# Patient Record
Sex: Female | Born: 2000 | Marital: Married
Health system: Northeastern US, Academic
[De-identification: ages and names within clinical notes are randomized; demographics above are authoritative.]

## PROBLEM LIST (undated history)

## (undated) HISTORY — PX: NO PAST SURGERIES: SHX2092

---
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
## 2019-01-10 ENCOUNTER — Encounter (HOSPITAL_BASED_OUTPATIENT_CLINIC_OR_DEPARTMENT_OTHER): Payer: Self-pay

## 2019-01-10 ENCOUNTER — Other Ambulatory Visit: Payer: Self-pay

## 2019-01-10 ENCOUNTER — Emergency Department (HOSPITAL_BASED_OUTPATIENT_CLINIC_OR_DEPARTMENT_OTHER)
Admission: EM | Admit: 2019-01-10 | Discharge: 2019-01-11 | Disposition: A | Discharge status: Home / Self Care | Payer: Self-pay | Attending: Emergency Medicine | Admitting: Emergency Medicine

## 2019-01-10 DIAGNOSIS — J069 Acute upper respiratory infection, unspecified: Secondary | ICD-10-CM | POA: Insufficient documentation

## 2019-01-10 DIAGNOSIS — F17210 Nicotine dependence, cigarettes, uncomplicated: Secondary | ICD-10-CM | POA: Insufficient documentation

## 2019-01-10 DIAGNOSIS — R0602 Shortness of breath: Secondary | ICD-10-CM | POA: Insufficient documentation

## 2019-01-10 DIAGNOSIS — Z20828 Contact with and (suspected) exposure to other viral communicable diseases: Secondary | ICD-10-CM | POA: Insufficient documentation

## 2019-01-10 MED ORDER — SODIUM CHLORIDE 0.9 % IV BOLUS
1000.0000 mL | Freq: Once | INTRAVENOUS | Status: AC
  Administered 2019-01-11: 1000 mL via INTRAVENOUS

## 2019-01-10 MED ORDER — ACETAMINOPHEN 325 MG PO TABS
650.0000 mg | ORAL_TABLET | Freq: Once | ORAL | Status: AC
  Administered 2019-01-11: 650 mg via ORAL
  Filled 2019-01-10: qty 2

## 2019-01-10 NOTE — ED Triage Notes (Signed)
Pt reports cough, fevers and SOB since this morning.

## 2019-01-11 ENCOUNTER — Emergency Department (HOSPITAL_BASED_OUTPATIENT_CLINIC_OR_DEPARTMENT_OTHER): Payer: Self-pay

## 2019-01-11 LAB — CBC WITH DIFFERENTIAL/PLATELET
Abs Immature Granulocytes: 0.03 10*3/uL (ref 0.00–0.07)
Basophils Absolute: 0.1 10*3/uL (ref 0.0–0.1)
Basophils Relative: 1 %
Eosinophils Absolute: 0.2 10*3/uL (ref 0.0–0.5)
Eosinophils Relative: 2 %
HCT: 34 % — ABNORMAL LOW (ref 36.0–46.0)
Hemoglobin: 9.9 g/dL — ABNORMAL LOW (ref 12.0–15.0)
Immature Granulocytes: 0 %
Lymphocytes Relative: 13 %
Lymphs Abs: 1.4 10*3/uL (ref 0.7–4.0)
MCH: 20.4 pg — ABNORMAL LOW (ref 26.0–34.0)
MCHC: 29.1 g/dL — ABNORMAL LOW (ref 30.0–36.0)
MCV: 70 fL — ABNORMAL LOW (ref 80.0–100.0)
Monocytes Absolute: 0.7 10*3/uL (ref 0.1–1.0)
Monocytes Relative: 7 %
Neutro Abs: 8 10*3/uL — ABNORMAL HIGH (ref 1.7–7.7)
Neutrophils Relative %: 77 %
Platelets: 368 10*3/uL (ref 150–400)
RBC: 4.86 MIL/uL (ref 3.87–5.11)
RDW: 18.8 % — ABNORMAL HIGH (ref 11.5–15.5)
WBC: 10.4 10*3/uL (ref 4.0–10.5)
nRBC: 0 % (ref 0.0–0.2)

## 2019-01-11 LAB — URINALYSIS, ROUTINE W REFLEX MICROSCOPIC
Bilirubin Urine: NEGATIVE
Glucose, UA: NEGATIVE mg/dL
Ketones, ur: NEGATIVE mg/dL
Nitrite: NEGATIVE
Protein, ur: NEGATIVE mg/dL
Specific Gravity, Urine: 1.01 (ref 1.005–1.030)
pH: 7 (ref 5.0–8.0)

## 2019-01-11 LAB — BASIC METABOLIC PANEL
Anion gap: 10 (ref 5–15)
BUN: 13 mg/dL (ref 6–20)
CO2: 23 mmol/L (ref 22–32)
Calcium: 9.4 mg/dL (ref 8.9–10.3)
Chloride: 105 mmol/L (ref 98–111)
Creatinine, Ser: 0.67 mg/dL (ref 0.44–1.00)
GFR calc Af Amer: >60 mL/min (ref >60)
GFR calc non Af Amer: >60 mL/min (ref >60)
Glucose, Bld: 100 mg/dL — ABNORMAL HIGH (ref 70–99)
Potassium: 3.5 mmol/L (ref 3.5–5.1)
Sodium: 138 mmol/L (ref 135–145)

## 2019-01-11 LAB — SARS CORONAVIRUS 2 AG (30 MIN TAT): SARS Coronavirus 2 Ag: NEGATIVE

## 2019-01-11 LAB — URINALYSIS, MICROSCOPIC (REFLEX): RBC / HPF: >50 RBC/hpf (ref 0–5)

## 2019-01-11 LAB — PREGNANCY, URINE: Preg Test, Ur: NEGATIVE

## 2019-01-11 MED ORDER — BENZONATATE 100 MG PO CAPS: 100.0000 mg | ORAL_CAPSULE | Freq: Three times a day (TID) | ORAL | 0 refills | Status: DC

## 2019-01-11 MED ORDER — KETOROLAC TROMETHAMINE 30 MG/ML IJ SOLN
30.0000 mg | Freq: Once | INTRAMUSCULAR | Status: AC
Start: 2019-01-11 — End: 2019-01-11
  Administered 2019-01-11: 30 mg via INTRAVENOUS
  Filled 2019-01-11: qty 1

## 2019-01-11 MED ORDER — IBUPROFEN 600 MG PO TABS: 600.0000 mg | ORAL_TABLET | Freq: Four times a day (QID) | ORAL | 0 refills | Status: DC | PRN

## 2019-01-11 NOTE — Discharge Instructions (Addendum)
You were seen today for upper respiratory complaints.  It is important that you stay hydrated.  Your work-up including chest x-ray is reassuring.  Take medications as prescribed.  You were tested for COVID and your initial testing is reassuring.  Outpatient testing is pending.  You should self isolate until definitive COVID testing has resulted.

## 2019-01-11 NOTE — ED Provider Notes (Signed)
[facility] EMERGENCY DEPARTMENT Provider Note   CSN: [id number] Arrival date & time: 01/10/19  2251     History   Chief Complaint Chief Complaint  Patient presents with  . Cough    HPI Barbara Braun is a 18 y.o. female.     HPI  This is a 18 year old female who presents with cough, congestion, shortness of breath.  Patient reports that onset of symptoms within the last 12 to 24 hours.  She reports chills at home.  Fever up to 100.  No known COVID exposures.  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  She reports nausea without vomiting.  She reports chest pain is worse with cough and breathing.  She reports shortness of breath.  Denies any abdominal pain or urinary symptoms.  No back pain.  History reviewed. No pertinent past medical history.  There are no active problems to display for this patient.   History reviewed. No pertinent surgical history.   OB History   No obstetric history on file.      Home Medications    Prior to Admission medications   Medication Sig Start Date End Date Taking? Authorizing Provider  benzonatate (TESSALON) 100 MG capsule Take 1 capsule (100 mg total) by mouth every 8 (eight) hours. 01/11/19   Lian Pounds, Mayer Maskerourtney F, MD  ibuprofen (ADVIL) 600 MG tablet Take 1 tablet (600 mg total) by mouth every 6 (six) hours as needed. 01/11/19   Cortlandt Capuano, Mayer Maskerourtney F, MD  methocarbamol (ROBAXIN) 500 MG tablet Take 1 tablet (500 mg total) by mouth 2 (two) times daily. 02/14/18   Maczis, Elmer SowMichael M, PA-C  naproxen (NAPROSYN) 500 MG tablet Take 1 tablet (500 mg total) by mouth 2 (two) times daily. 02/14/18   Maczis, Elmer SowMichael M, PA-C    Family History No family history on file.  Social History Social History   Tobacco Use  . Smoking status: Smoker, Current Status Unknown  Substance Use Topics  . Alcohol use: Never    Frequency: Never  . Drug use: Never     Allergies   Patient has no known allergies.   Review of Systems  Review of Systems  Constitutional: Positive for chills and fever.  HENT: Positive for congestion.   Respiratory: Positive for cough and shortness of breath.   Cardiovascular: Positive for chest pain. Negative for leg swelling.  Gastrointestinal: Positive for nausea. Negative for abdominal pain and vomiting.  Genitourinary: Negative for dysuria and flank pain.  All other systems reviewed and are negative.    Physical Exam Updated Vital Signs BP (!) 137/91   Pulse (!) 101   Temp 98.6 F (37 C) (Oral)   Resp (!) 24   LMP 01/10/2019   SpO2 97%   Physical Exam Vitals signs and nursing note reviewed.  Constitutional:      Appearance: She is well-developed. She is not ill-appearing.  HENT:     Head: Normocephalic and atraumatic.     Nose: Congestion present.     Mouth/Throat:     Mouth: Mucous membranes are moist.     Comments: Uvula midline, no tonsillar exudate or erythema Eyes:     Pupils: Pupils are equal, round, and reactive to light.  Neck:     Musculoskeletal: Neck supple.  Cardiovascular:     Rate and Rhythm: Regular rhythm. Tachycardia present.     Heart sounds: Normal heart sounds.  Pulmonary:     Effort: Pulmonary effort is normal. No respiratory distress.  Breath sounds: No wheezing.  Abdominal:     Palpations: Abdomen is soft.     Tenderness: There is no abdominal tenderness.  Musculoskeletal:     Right lower leg: No edema.     Left lower leg: No edema.  Skin:    General: Skin is warm and dry.  Neurological:     Mental Status: She is alert and oriented to person, place, and time.  Psychiatric:        Mood and Affect: Mood normal.      ED Treatments / Results  Labs (all labs ordered are listed, but only abnormal results are displayed) Labs Reviewed  CBC WITH DIFFERENTIAL/PLATELET - Abnormal; Notable for the following components:      Result Value   Hemoglobin 9.9 (*)    HCT 34.0 (*)    MCV 70.0 (*)    MCH 20.4 (*)    MCHC 29.1 (*)    RDW  18.8 (*)    Neutro Abs 8.0 (*)    All other components within normal limits  BASIC METABOLIC PANEL - Abnormal; Notable for the following components:   Glucose, Bld 100 (*)    All other components within normal limits  URINALYSIS, ROUTINE W REFLEX MICROSCOPIC - Abnormal; Notable for the following components:   Color, Urine RED (*)    APPearance CLOUDY (*)    Hgb urine dipstick LARGE (*)    Leukocytes,Ua TRACE (*)    All other components within normal limits  URINALYSIS, MICROSCOPIC (REFLEX) - Abnormal; Notable for the following components:   Bacteria, UA RARE (*)    All other components within normal limits  SARS CORONAVIRUS 2 (HOSP ORDER, PERFORMED IN [facility] VIA ABBOTT ID)  NOVEL CORONAVIRUS, NAA (HOSPITAL ORDER, SEND-OUT TO REF LAB)  PREGNANCY, URINE    EKG None  Radiology Dg Chest Port 1 View  Result Date: 01/11/2019 CLINICAL DATA:  18 y/o  F; cough, fever, shortness of breath. EXAM: PORTABLE CHEST 1 VIEW COMPARISON:  None. FINDINGS: The heart size and mediastinal contours are within normal limits. Both lungs are clear. The visualized skeletal structures are unremarkable. IMPRESSION: No active disease. Electronically Signed   By: Kristine Garbe M.D.   On: 01/11/2019 00:29    Procedures Procedures (including critical care time)  Medications Ordered in ED Medications  ketorolac (TORADOL) 30 MG/ML injection 30 mg (has no administration in time range)  acetaminophen (TYLENOL) tablet 650 mg (650 mg Oral Given 01/11/19 0001)  sodium chloride 0.9 % bolus 1,000 mL (0 mLs Intravenous Stopped 01/11/19 0026)     Initial Impression / Assessment and Plan / ED Course  I have reviewed the triage vital signs and the nursing notes.  Pertinent labs & imaging results that were available during my care of the patient were reviewed by me and considered in my medical decision making (see chart for details).        Patient presents today with upper respiratory symptoms.   She is overall nontoxic-appearing on exam.  Vital signs are notable for pulse rate in the 110s.  She is in no respiratory distress.  Suspect likely viral etiology.  She is low risk for COVID although COVID testing was sent.  Chest x-ray shows no evidence of pneumonia.  Basic lab work including CBC, BMP and urinalysis are largely unremarkable.  Patient was given fluids, Tylenol, and Toradol.  Recommend supportive measures at home.  Initial COVID testing was negative.  Outpatient testing is pending.  After history, exam, and  medical workup I feel the patient has been appropriately medically screened and is safe for discharge home. Pertinent diagnoses were discussed with the patient. Patient was given return precautions.   Barbara Braun was evaluated in Emergency Department on 01/11/2019 for the symptoms described in the history of present illness. She was evaluated in the context of the global COVID-19 pandemic, which necessitated consideration that the patient might be at risk for infection with the SARS-CoV-2 virus that causes COVID-19. Institutional protocols and algorithms that pertain to the evaluation of patients at risk for COVID-19 are in a state of rapid change based on information released by regulatory bodies including the CDC and federal and state organizations. These policies and algorithms were followed during the patient's care in the ED.   Final Clinical Impressions(s) / ED Diagnoses   Final diagnoses:  Viral URI with cough    ED Discharge Orders         Ordered    benzonatate (TESSALON) 100 MG capsule  Every 8 hours     01/11/19 0256    ibuprofen (ADVIL) 600 MG tablet  Every 6 hours PRN     01/11/19 0256           Shon BatonHorton, Jamarii Banks F, MD 01/11/19 0300

## 2019-01-11 NOTE — ED Notes (Signed)
PT tolerating PO(s well

## 2019-01-12 LAB — NOVEL CORONAVIRUS, NAA (HOSP ORDER, SEND-OUT TO REF LAB; TAT 18-24 HRS): SARS-CoV-2, NAA: NOT DETECTED

## 2019-01-25 ENCOUNTER — Encounter (HOSPITAL_COMMUNITY): Payer: Self-pay

## 2019-01-25 ENCOUNTER — Emergency Department (HOSPITAL_COMMUNITY)
Admission: EM | Admit: 2019-01-25 | Discharge: 2019-01-25 | Disposition: A | Discharge status: Home / Self Care | Payer: Self-pay | Attending: Emergency Medicine | Admitting: Emergency Medicine

## 2019-01-25 ENCOUNTER — Other Ambulatory Visit: Payer: Self-pay

## 2019-01-25 DIAGNOSIS — L02416 Cutaneous abscess of left lower limb: Secondary | ICD-10-CM | POA: Insufficient documentation

## 2019-01-25 DIAGNOSIS — L0291 Cutaneous abscess, unspecified: Secondary | ICD-10-CM

## 2019-01-25 DIAGNOSIS — Z79899 Other long term (current) drug therapy: Secondary | ICD-10-CM | POA: Insufficient documentation

## 2019-01-25 MED ORDER — CLINDAMYCIN HCL 150 MG PO CAPS: 300.0000 mg | ORAL_CAPSULE | Freq: Three times a day (TID) | ORAL | 0 refills | Status: AC

## 2019-01-25 MED ORDER — LIDOCAINE-PRILOCAINE 2.5-2.5 % EX CREA
TOPICAL_CREAM | Freq: Once | CUTANEOUS | Status: AC
  Administered 2019-01-25: 02:00:00 via TOPICAL
  Filled 2019-01-25: qty 5

## 2019-01-25 MED ORDER — HYDROCODONE-ACETAMINOPHEN 5-325 MG PO TABS
1.0000 | ORAL_TABLET | Freq: Once | ORAL | Status: AC
  Administered 2019-01-25: 1 via ORAL
  Filled 2019-01-25: qty 1

## 2019-01-25 NOTE — ED Notes (Signed)
ED Provider at bedside. 

## 2019-01-25 NOTE — ED Triage Notes (Signed)
Pt reports ? Bite to left leg.  Reports redness and swelling to back of left leg.  Denies Drainage.  Denies fevers.  NAD

## 2019-01-25 NOTE — ED Provider Notes (Signed)
[facility] EMERGENCY DEPARTMENT Provider Note   CSN: [id number] Arrival date & time: 01/25/19  0037     History   Chief Complaint Chief Complaint  Patient presents with  . Insect Bite    HPI Barbara Braun is a 18 y.o. female.     Patient report redness and swelling and tenderness to the back of the left upper leg.  Patient reports possible bite.  No drainage from the area.  No fevers.  Hurts to move the leg.  No family history of abscess.  No personal history of abscess.  No numbness, no weakness.  The history is provided by the patient. No language interpreter was used.  Abscess Location:  Leg Leg abscess location:  L upper leg Size:  5 Abscess quality: induration, painful, redness and warmth   Red streaking: no   Duration:  3 days Progression:  Worsening Pain details:    Quality:  Aching   Severity:  Mild   Duration:  2 days   Timing:  Intermittent   Progression:  Unchanged Chronicity:  New Context: insect bite/sting   Context: not diabetes and not skin injury   Relieved by:  None tried Ineffective treatments:  None tried Associated symptoms: no anorexia, no fatigue, no fever, no headaches, no nausea and no vomiting   Risk factors: no hx of MRSA and no prior abscess     History reviewed. No pertinent past medical history.  There are no active problems to display for this patient.   History reviewed. No pertinent surgical history.   OB History   No obstetric history on file.      Home Medications    Prior to Admission medications   Medication Sig Start Date End Date Taking? Authorizing Provider  benzonatate (TESSALON) 100 MG capsule Take 1 capsule (100 mg total) by mouth every 8 (eight) hours. 01/11/19   Horton, Barbette Hair, MD  clindamycin (CLEOCIN) 150 MG capsule Take 2 capsules (300 mg total) by mouth 3 (three) times daily for 7 days. 01/25/19 02/01/19  Louanne Skye, MD  ibuprofen (ADVIL) 600 MG tablet Take 1 tablet (600 mg total) by  mouth every 6 (six) hours as needed. 01/11/19   Horton, Barbette Hair, MD  methocarbamol (ROBAXIN) 500 MG tablet Take 1 tablet (500 mg total) by mouth 2 (two) times daily. 02/14/18   Maczis, Barth Kirks, PA-C  naproxen (NAPROSYN) 500 MG tablet Take 1 tablet (500 mg total) by mouth 2 (two) times daily. 02/14/18   Maczis, Barth Kirks, PA-C    Family History No family history on file.  Social History Social History   Tobacco Use  . Smoking status: Smoker, Current Status Unknown  Substance Use Topics  . Alcohol use: Never    Frequency: Never  . Drug use: Never     Allergies   Patient has no known allergies.   Review of Systems Review of Systems  Constitutional: Negative for fatigue and fever.  Gastrointestinal: Negative for anorexia, nausea and vomiting.  Neurological: Negative for headaches.  All other systems reviewed and are negative.    Physical Exam Updated Vital Signs BP 123/78   Pulse 92   Temp 98.2 F (36.8 C) (Oral)   Resp 20   LMP 01/10/2019   SpO2 100%   Physical Exam Vitals signs and nursing note reviewed.  Constitutional:      Appearance: She is well-developed.  HENT:     Head: Normocephalic and atraumatic.     Right Ear: External ear  normal.     Left Ear: External ear normal.  Eyes:     Conjunctiva/sclera: Conjunctivae normal.  Neck:     Musculoskeletal: Normal range of motion and neck supple.  Cardiovascular:     Rate and Rhythm: Normal rate.     Heart sounds: Normal heart sounds.  Pulmonary:     Effort: Pulmonary effort is normal.     Breath sounds: Normal breath sounds.  Abdominal:     General: Bowel sounds are normal.     Palpations: Abdomen is soft.     Tenderness: There is no abdominal tenderness. There is no rebound.  Musculoskeletal: Normal range of motion.  Skin:    Comments: Patient with 5 cm of redness, with 4 cm of induration.  Small central area noted but no head or drainage noted.  It is tender and warm to touch.  Neurological:      Mental Status: She is alert and oriented to person, place, and time.      ED Treatments / Results  Labs (all labs ordered are listed, but only abnormal results are displayed) Labs Reviewed - No data to display  EKG None  Radiology No results found.  Procedures .Marland Kitchen.Incision and Drainage  Date/Time: 01/25/2019 3:39 AM Performed by: Niel HummerKuhner, Dorse Locy, MD Authorized by: Niel HummerKuhner, Isom Kochan, MD   Consent:    Consent obtained:  Verbal   Consent given by:  Patient   Risks discussed:  Bleeding, incomplete drainage and infection   Alternatives discussed:  Observation and no treatment Location:    Type:  Abscess   Size:  5   Location:  Lower extremity   Lower extremity location:  Leg   Leg location:  L upper leg Pre-procedure details:    Skin preparation:  Betadine Anesthesia (see MAR for exact dosages):    Anesthesia method:  Topical application   Topical anesthetic:  EMLA cream Procedure type:    Complexity:  Simple Procedure details:    Incision types:  Stab incision   Incision depth:  Fascia   Scalpel blade:  11   Drainage:  Purulent   Drainage amount:  Moderate   Wound treatment:  Drain placed   Packing materials:  1/4 in gauze Post-procedure details:    Patient tolerance of procedure:  Tolerated well, no immediate complications   (including critical care time)  Medications Ordered in ED Medications  lidocaine-prilocaine (EMLA) cream ( Topical Given 01/25/19 0139)  HYDROcodone-acetaminophen (NORCO/VICODIN) 5-325 MG per tablet 1 tablet (1 tablet Oral Given 01/25/19 0139)     Initial Impression / Assessment and Plan / ED Course  I have reviewed the triage vital signs and the nursing notes.  Pertinent labs & imaging results that were available during my care of the patient were reviewed by me and considered in my medical decision making (see chart for details).        18 year old with warm red indurated area on posterior upper thigh.  Patient with likely abscess.  Patient  given pain medication and EMLA was placed on wound and I was able to make a incision and drained copious amounts of pus and blood.  Wound was packed with quarter inch gauze.  Will start patient on clindamycin.  Will have patient follow-up with PCP in 3 days for reevaluation.  Discussed signs warrant sooner reevaluation.    Final Clinical Impressions(s) / ED Diagnoses   Final diagnoses:  Abscess    ED Discharge Orders         Ordered  clindamycin (CLEOCIN) 150 MG capsule  3 times daily     01/25/19 0244           Niel HummerKuhner, Karren Newland, MD 01/25/19 [phone]

## 2019-05-13 ENCOUNTER — Telehealth: Payer: Self-pay | Admitting: Family Medicine

## 2019-05-13 NOTE — Telephone Encounter (Signed)
Patient is calling to receive negative COVID results. Patient expressed understanding. °

## 2019-08-19 ENCOUNTER — Ambulatory Visit: Payer: Self-pay

## 2019-08-19 NOTE — Telephone Encounter (Signed)
Patient called stating that she had her 1st COVID-19 vaccine yesterday RT arm.  She states that before she got home her arm was hurting terribly.  She rates the pain at 10.  She states that she just feels bad.  She is nauseated. She is concerned because she has had recent HepB vaccine and TB test 1 week ago in left arm.  She states she told them about the vaccine prior to receiving the COVID-19 vaccine. She states she has a swollen lump on the affected right arm. Per protocol patient will go to UC for evaluation. She has no PCP. She was read care advice and verbalized understanding.  Reason for Disposition . [1] Redness or red streak around the injection site AND [2] started > 48 hours after getting vaccine AND [3] fever  Answer Assessment - Initial Assessment Questions 1. MAIN CONCERN OR SYMPTOM:  "What is your main concern right now?" "What question do you have?" "What's the main symptom you're worried about?" (e.g., fever, pain, redness, swelling)    pain 2. VACCINE: "What vaccination did you receive?" "Is this your first or second shot?" (e.g., none; Moderna, Pfizer, other)     1st dose unsure 3. SYMPTOM ONSET: "When did the pain begin?" (e.g., not relevant; hours, days)      Received yesterday about 11:30. Pain in arm rt 4. SYMPTOM SEVERITY: "How bad is it?"      Pain 10 5. FEVER: "Is there a fever?" If so, ask: "What is it, how was it measured, and when did it start?"      unknown 6. PAST REACTIONS: "Have you reacted to immunizations before?" If so, ask: "What happened?"    no 7. OTHER SYMPTOMS: "Do you have any other symptoms?"     Nausea, confusion,  Protocols used: CORONAVIRUS (COVID-19) VACCINE QUESTIONS AND REACTIONS-A-AH

## 2019-09-22 ENCOUNTER — Encounter: Payer: Self-pay | Admitting: Obstetrics & Gynecology

## 2019-09-22 ENCOUNTER — Other Ambulatory Visit: Payer: Self-pay

## 2019-09-22 ENCOUNTER — Ambulatory Visit (INDEPENDENT_AMBULATORY_CARE_PROVIDER_SITE_OTHER): Payer: Medicaid Other | Admitting: Obstetrics & Gynecology

## 2019-09-22 VITALS — BP 119/74 | HR 83 | Temp 97.1°F | Ht 59.0 in | Wt 112.0 lb

## 2019-09-22 DIAGNOSIS — Z7189 Other specified counseling: Secondary | ICD-10-CM | POA: Diagnosis not present

## 2019-09-22 DIAGNOSIS — Z30011 Encounter for initial prescription of contraceptive pills: Secondary | ICD-10-CM | POA: Diagnosis not present

## 2019-09-22 DIAGNOSIS — Z7185 Encounter for immunization safety counseling: Secondary | ICD-10-CM

## 2019-09-22 MED ORDER — NORETHIN ACE-ETH ESTRAD-FE 1-20 MG-MCG(24) PO TABS
1.0000 | ORAL_TABLET | Freq: Every day | ORAL | 11 refills | Status: AC
Start: 2019-09-22 — End: ?

## 2019-09-22 NOTE — Patient Instructions (Signed)
Oral Contraception Information Oral contraceptive pills (OCPs) are medicines taken to prevent pregnancy. OCPs are taken by mouth, and they work by:  Preventing the ovaries from releasing eggs.  Thickening mucus in the lower part of the uterus (cervix), which prevents sperm from entering the uterus.  Thinning the lining of the uterus (endometrium), which prevents a fertilized egg from attaching to the endometrium. OCPs are highly effective when taken exactly as prescribed. However, OCPs do not prevent STIs (sexually transmitted infections). Safe sex practices, such as using condoms while on an OCP, can help prevent STIs. Before starting OCPs Before you start taking OCPs, you may have a physical exam, blood test, and Pap test. However, you are not required to have a pelvic exam in order to be prescribed OCPs. Your health care provider will make sure you are a good candidate for oral contraception. OCPs are not a good option for certain women, including women who smoke and are older than 35 years, and women with a medical history of high blood pressure, deep vein thrombosis, pulmonary embolism, stroke, cardiovascular disease, or peripheral vascular disease. Discuss with your health care provider the possible side effects of the OCP you may be prescribed. When you start an OCP, be aware that it can take 2-3 months for your body to adjust to changes in hormone levels. Follow instructions from your health care provider about how to start taking your first cycle of OCPs. Depending on when you start the pill, you may need to use a backup form of birth control, such as condoms, during the first week. Make sure you know what steps to take if you ever forget to take the pill. Types of oral contraception  The most common types of birth control pills contain the hormones estrogen and progestin (synthetic progesterone) or progestin only. The combination pill This type of pill contains estrogen and progestin  hormones. Combination pills often come in packs of 21, 28, or 91 pills. For each pack, the last 7 pills may not contain hormones, which means you may stop taking the pills for 7 days. Menstrual bleeding occurs during the week that you do not take the pills or that you take the pills with no hormones in them. The minipill This type of pill contains the progestin hormone only. It comes in packs of 28 pills. All 28 pills contain the hormone. You take the pill every day. It is very important to take the pill at the same time each day. Advantages of oral contraceptive pills  Provides reliable and continuous contraception if taken as instructed.  May treat or decrease symptoms of: ? Menstrual period cramps. ? Irregular menstrual cycle or bleeding. ? Heavy menstrual flow. ? Abnormal uterine bleeding. ? Acne, depending on the type of pill. ? Polycystic ovarian syndrome. ? Endometriosis. ? Iron deficiency anemia. ? Premenstrual symptoms, including premenstrual dysphoric disorder.  May reduce the risk of endometrial and ovarian cancer.  Can be used as emergency contraception.  Prevents mislocated (ectopic) pregnancies and infections of the fallopian tubes. Things that can make oral contraceptive pills less effective OCPs can be less effective if:  You forget to take the pill at the same time every day. This is especially important when taking the minipill.  You have a stomach or intestinal disease that reduces your body's ability to absorb the pill.  You take OCPs with other medicines that make OCPs less effective, such as antibiotics, certain HIV medicines, and some seizure medicines.  You take expired OCPs.  You forget to restart the pill on day 7, if using the packs of 21 pills. Risks associated with oral contraceptive pills Oral contraceptive pills can sometimes cause side effects, such as:  Headache.  Depression.  Trouble sleeping.  Nausea and vomiting.  Breast  tenderness.  Irregular bleeding or spotting during the first several months.  Bloating or fluid retention.  Increase in blood pressure. Combination pills are also associated with a small increase in the risk of:  Blood clots.  Heart attack.  Stroke. Summary  Oral contraceptive pills are medicines taken by mouth to prevent pregnancy. They are highly effective when taken exactly as prescribed.  The most common types of birth control pills contain the hormones estrogen and progestin (synthetic progesterone) or progestin only.  Before you start taking the pill, you may have a physical exam, blood test, and Pap test. Your health care provider will make sure you are a good candidate for oral contraception.  The combination pill may come in a 21-day pack, a 28-day pack, or a 91-day pack. The minipill contains the progesterone hormone only and comes in packs of 28 pills.  Oral contraceptive pills can sometimes cause side effects, such as headache, nausea, breast tenderness, or irregular bleeding. This information is not intended to replace advice given to you by your health care provider. Make sure you discuss any questions you have with your health care provider. Document Revised: 05/30/2017 Document Reviewed: 09/10/2016 Elsevier Patient Education  2020 [employer].  Oral Contraception Use Oral contraceptive pills (OCPs) are medicines that you take to prevent pregnancy. OCPs work by:  Preventing the ovaries from releasing eggs.  Thickening mucus in the lower part of the uterus (cervix), which prevents sperm from entering the uterus.  Thinning the lining of the uterus (endometrium), which prevents a fertilized egg from attaching to the endometrium. OCPs are highly effective when taken exactly as prescribed. However, OCPs do not prevent sexually transmitted infections (STIs). Safe sex practices, such as using condoms while on an OCP, can help prevent STIs. Before taking OCPs, you may  have a physical exam, blood test, and Pap test. A Pap test involves taking a sample of cells from your cervix to check for cancer. Discuss with your health care provider the possible side effects of the OCP you may be prescribed. When you start an OCP, be aware that it can take 2-3 months for your body to adjust to changes in hormone levels. How to take oral contraceptive pills Follow instructions from your health care provider about how to start taking your first cycle of OCPs. Your health care provider may recommend that you:  Start the pill on day 1 of your menstrual period. If you start at this time, you will not need any backup form of birth control (contraception), such as condoms.  Start the pill on the first Sunday after your menstrual period or on the day you get your prescription. In these cases, you will need to use backup contraception for the first week.  Start the pill at any time of your cycle. ? If you take the pill within 5 days of the start of your period, you will not need a backup form of contraception. ? If you start at any other time of your menstrual cycle, you will need to use another form of contraception for 7 days. If your OCP is the type called a minipill, it will protect you from pregnancy after taking it for 2 days (48 hours), and you can  stop using backup contraception after that time. After you have started taking OCPs:  If you forget to take 1 pill, take it as soon as you remember. Take the next pill at the regular time.  If you miss 2 or more pills, call your health care provider. Different pills have different instructions for missed doses. Use backup birth control until your next menstrual period starts.  If you use a 28-day pack that contains inactive pills and you miss 1 of the last 7 pills (pills with no hormones), throw away the rest of the non-hormone pills and start a new pill pack. No matter which day you start the OCP, you will always start a new pack on  that same day of the week. Have an extra pack of OCPs and a backup contraceptive method available in case you miss some pills or lose your OCP pack. Follow these instructions at home:  Do not use any products that contain nicotine or tobacco, such as cigarettes and e-cigarettes. If you need help quitting, ask your health care provider.  Always use a condom to protect against STIs. OCPs do not protect against STIs.  Use a calendar to mark the days of your menstrual period.  Read the information and directions that came with your OCP. Talk to your health care provider if you have questions. Contact a health care provider if:  You develop nausea and vomiting.  You have abnormal vaginal discharge or bleeding.  You develop a rash.  You miss your menstrual period. Depending on the type of OCP you are taking, this may be a sign of pregnancy. Ask your health care provider for more information.  You are losing your hair.  You need treatment for mood swings or depression.  You get dizzy when taking the OCP.  You develop acne after taking the OCP.  You become pregnant or think you may be pregnant.  You have diarrhea, constipation, and abdominal pain or cramps.  You miss 2 or more pills. Get help right away if:  You develop chest pain.  You develop shortness of breath.  You have an uncontrolled or severe headache.  You develop numbness or slurred speech.  You develop visual or speech problems.  You develop pain, redness, and swelling in your legs.  You develop weakness or numbness in your arms or legs. Summary  Oral contraceptive pills (OCPs) are medicines that you take to prevent pregnancy.  OCPs do not prevent sexually transmitted infections (STIs). Always use a condom to protect against STIs.  When you start an OCP, be aware that it can take 2-3 months for your body to adjust to changes in hormone levels.  Read all the information and directions that come with your  OCP. This information is not intended to replace advice given to you by your health care provider. Make sure you discuss any questions you have with your health care provider. Document Revised: 10/09/2018 Document Reviewed: 07/29/2016 Elsevier Patient Education  2020 [employer].     What You Need to Know About HPV and Cancer HPV (human papillomavirus)is a virus that is passed easily from person to person through sexual contact. It is the most common STI (sexually transmitted infection). There are many types of HPV. Most people who are infected with HPV do not have symptoms. In some cases, HPV can cause warts or lesions in the genital or throat area. Certain types of HPV can also cause cancer. HPV infections can cause various kinds of cancer, including:  Cervical cancer.  Vaginal cancer.  Vulvar cancer.  Anal cancer.  Throat cancer.  Tongue or mouth cancer.  Penile cancer. Some HPV infections go away on their own within 1-2 years, but other HPV infections may cause changes in cells that could lead to cancer. You can take steps to avoid HPV infection and to lower your risk of getting cancer. How does HPV spread? HPV spreads easily through sexual contact. You can get HPV from vaginal sex, oral sex, anal sex, or just by touching someone's genitals. Even people who have only one sexual partner may have HPV. HPV often does not cause symptoms, so most infected people do not know that they have it. How can I prevent HPV infection? Take the following steps to help prevent HPV infection:  Talk with your health care provider about getting the HPV vaccine. This vaccine protects against the types of HPV that could cause cancer.  Limit the number of people you have sex with. Also avoid having sex with people who have had many sexual partners.  Use a latex condom during sex.  Talk with your sexual partners about their health. What can I do to lower my risk of cancer? Having a healthy  lifestyle and taking some preventive steps can help lower your cancer risk, whether or not you have HPV. Some steps you can take include: Lifestyle  Practice safe sex to help prevent HPV infection.  Do not use any products that contain nicotine or tobacco, such as cigarettes and e-cigarettes. If you need help quitting, ask your health care provider.  Eat foods that have antioxidants, such as fruits, vegetables, and grains. Try to eat at least 5 servings of fruits and vegetables every day.  Get regular exercise.  Lose weight if you are overweight.  Practice good oral hygiene. This includes flossing and brushing your teeth every day. Other preventive steps  Get the HPV vaccine as told by your health care provider.  Get tested for STIs even if you do not have symptoms of HPV. You may have HPV and not know it.  If you are a woman, get regular Pap tests. Talk with your health care provider about how often you need these tests. Pap tests will help identify changes in cells that can lead to cancer. Where to find support Ask your health care provider what brochures, flyers, websites, and community resources may be available to you. Where to find more information Learn more about HPV and cancer from:  Centers for Disease Control and Prevention: RunningConvention.de  National Cancer Institute: www.cancer.gov  American Cancer Society: www.cancer.org Contact a health care provider if:  You have genital warts.  You are sexually active and think you may have HPV.  You did not protect yourself during sex and think you may have HPV. Summary  HPV (human papillomavirus)is a virus that is passed easily from person to person through sexual contact.  Certain types of HPV may cause changes in cells that could lead to cancer.  You should take steps to avoid HPV infection, such as limiting the number of people you have sex with, using condoms during sex, and getting the HPV vaccine.  Lifestyle  changes can help lower your risk of cancer. These include eating a healthy diet, getting regular exercise, and not using any products that contain nicotine or tobacco.  You may have HPV and not know it. Get tested for STIs even if you do not have symptoms of HPV. If you are a woman, have regular  Pap tests as directed by your health care provider. This information is not intended to replace advice given to you by your health care provider. Make sure you discuss any questions you have with your health care provider. Document Revised: 10/05/2018 Document Reviewed: 03/07/2016 Elsevier Patient Education  2020 Elsevier Inc.     HPV (Human Papillomavirus) Vaccine: What You Need to Know 1. Why get vaccinated? HPV (Human papillomavirus) vaccine can prevent infection with some types of human papillomavirus. HPV infections can cause certain types of cancers including:  cervical, vaginal and vulvar cancers in women,  penile cancer in men, and  anal cancers in both men and women. HPV vaccine prevents infection from the HPV types that cause over 90% of these cancers. HPV is spread through intimate skin-to-skin or sexual contact. HPV infections are so common that nearly all men and women will get at least one type of HPV at some time in their lives. Most HPV infections go away by themselves within 2 years. But sometimes HPV infections will last longer and can cause cancers later in life. 2. HPV vaccine HPV vaccine is routinely recommended for adolescents at 7211 or 20 years of age to ensure they are protected before they are exposed to the virus. HPV vaccine may be given beginning at age 269 years, and as late as age 20 years. Most people older than 26 years will not benefit from HPV vaccination. Talk with your health care provider if you want more information. Most children who get the first dose before 20 years of age need 2 doses of HPV vaccine. Anyone who gets the first dose on or after 20 years of age,  and younger people with certain immunocompromising conditions, need 3 doses. Your health care provider can give you more information. HPV vaccine may be given at the same time as other vaccines. 3. Talk with your health care provider Tell your vaccine provider if the person getting the vaccine:  Has had an allergic reaction after a previous dose of HPV vaccine, or has any severe, life-threatening allergies.  Is pregnant. In some cases, your health care provider may decide to postpone HPV vaccination to a future visit. People with minor illnesses, such as a cold, may be vaccinated. People who are moderately or severely ill should usually wait until they recover before getting HPV vaccine. Your health care provider can give you more information. 4. Risks of a vaccine reaction  Soreness, redness, or swelling where the shot is given can happen after HPV vaccine.  Fever or headache can happen after HPV vaccine. People sometimes faint after medical procedures, including vaccination. Tell your provider if you feel dizzy or have vision changes or ringing in the ears. As with any medicine, there is a very remote chance of a vaccine causing a severe allergic reaction, other serious injury, or death. 5. What if there is a serious problem? An allergic reaction could occur after the vaccinated person leaves the clinic. If you see signs of a severe allergic reaction (hives, swelling of the face and throat, difficulty breathing, a fast heartbeat, dizziness, or weakness), call 9-1-1 and get the person to the nearest hospital. For other signs that concern you, call your health care provider. Adverse reactions should be reported to the Vaccine Adverse Event Reporting System (VAERS). Your health care provider will usually file this report, or you can do it yourself. Visit the VAERS website at www.vaers.LAgents.nohhs.gov or call 77477865141-680 431 7120. VAERS is only for reporting reactions, and VAERS staff do not  give medical  advice. 6. The National Vaccine Injury Compensation Program The [employer] Vaccine Injury Compensation Program (VICP) is a federal program that was created to compensate people who may have been injured by certain vaccines. Visit the VICP website at GoldCloset.com.ee or call 947 805 0786 to learn about the program and about filing a claim. There is a time limit to file a claim for compensation. 7. How can I learn more?  Ask your health care provider.  Call your local or state health department.  Contact the Centers for Disease Control and Prevention (CDC): ? Call (484)353-7919 (1-800-CDC-INFO) or ? Visit CDC's website at http://hunter.com/ Vaccine Information Statement HPV Vaccine (04/29/2018) This information is not intended to replace advice given to you by your health care provider. Make sure you discuss any questions you have with your health care provider. Document Revised: 10/06/2018 Document Reviewed: 01/27/2018 Elsevier Patient Education  [place].

## 2019-09-22 NOTE — Progress Notes (Signed)
GYNECOLOGY OFFICE VISIT NOTE  History:   Barbara Braun is a 19 y.o. G0P0000 here today for consultation about starting OCPs for heavy periods. She denies any current abnormal vaginal discharge, pelvic pain or other concerns.    History reviewed. No pertinent past medical history.  Past Surgical History:  Procedure Laterality Date  . NO PAST SURGERIES      The following portions of the patient's history were reviewed and updated as appropriate: allergies, current medications, past family history, past medical history, past social history, past surgical history and problem list.   Health Maintenance:  Unsure if she received HPV vaccine   Review of Systems:  Pertinent items noted in HPI and remainder of comprehensive ROS otherwise negative.  Physical Exam:  BP 119/74   Pulse 83   Temp (!) 97.1 F (36.2 C)   Ht 4\' 11"  (1.499 m)   Wt 112 lb (50.8 kg)   LMP 09/20/2019 (Exact Date)   BMI 22.62 kg/m  CONSTITUTIONAL: Well-developed, well-nourished female in no acute distress.  HEENT:  Normocephalic, atraumatic. External right and left ear normal. No scleral icterus.  NECK: Normal range of motion, supple, no masses noted on observation SKIN: No rash noted. Not diaphoretic. No erythema. No pallor. MUSCULOSKELETAL: Normal range of motion. No edema noted. NEUROLOGIC: Alert and oriented to person, place, and time. Normal muscle tone coordination. No cranial nerve deficit noted. PSYCHIATRIC: Normal mood and affect. Normal behavior. Normal judgment and thought content. CARDIOVASCULAR: Normal heart rate noted RESPIRATORY: Effort and breath sounds normal, no problems with respiration noted ABDOMEN: No masses noted. No other overt distention noted.   PELVIC: Deferred     Assessment and Plan:    1. Oral contraception initiation The use of the oral contraceptive has been fully discussed with the patient. This includes the proper method to initiate (i.e. Sunday start after next normal  menstrual onset versus same day start) and continue the pills, the need for regular compliance to ensure adequate contraceptive effect, the physiology which make the pill effective, the instructions for what to do in event of a missed pill, and warnings about anticipated minor side effects such as breakthrough spotting, nausea, breast tenderness, weight changes, acne, headaches, etc.  She has been told of the more serious potential side effects such as MI, stroke, and deep vein thrombosis, all of which are very unlikely.  She has been asked to report any signs of such serious problems immediately.  She should back up the pill with a condom during any cycle in which antibiotics are prescribed, and during the first cycle as well. The need for additional protection, such as a condom, to prevent exposure to sexually transmitted diseases has also been discussed- the patient has been clearly reminded that OCP's cannot protect her against diseases such as HIV and others. She understands and wishes to take the medication as prescribed.She was given a prescription for Loestrin 24 Fe and will return in 2 months for BP and contraceptive check. - Norethindrone Acetate-Ethinyl Estrad-FE (LOESTRIN 24 FE) 1-20 MG-MCG(24) tablet; Take 1 tablet by mouth daily.  Dispense: 1 Package; Refill: 11  2. HPV vaccine counseling Counseled about HPV vaccination. She will find out if she received this as a child, if not, she will consider getting it here.    Routine preventative health maintenance measures emphasized. Please refer to After Visit Summary for other counseling recommendations.   Return in about 2 months (around 11/22/2019) for OCP OFFICE Followup and BP check.  Total face-to-face time with patient: 20 minutes.  Over 50% of encounter was spent on counseling and coordination of care.   Verita Schneiders, MD, [place] for [employer], [place]

## 2019-11-22 ENCOUNTER — Ambulatory Visit: Payer: Medicaid Other | Admitting: Obstetrics & Gynecology

## 2019-11-22 NOTE — Progress Notes (Deleted)
   Patient did not show up today for her scheduled appointment.   Maisha Bogen, MD, FACOG Obstetrician & Gynecologist, [facility],  [facility]  

## 2020-06-30 IMAGING — DX PORTABLE CHEST - 1 VIEW
1 series · 1 of 1 positions shown · non-contrast
Comparison: None.

CLINICAL DATA: 18 y/o  F; cough, fever, shortness of breath.

EXAM:
PORTABLE CHEST 1 VIEW

[chest ap]
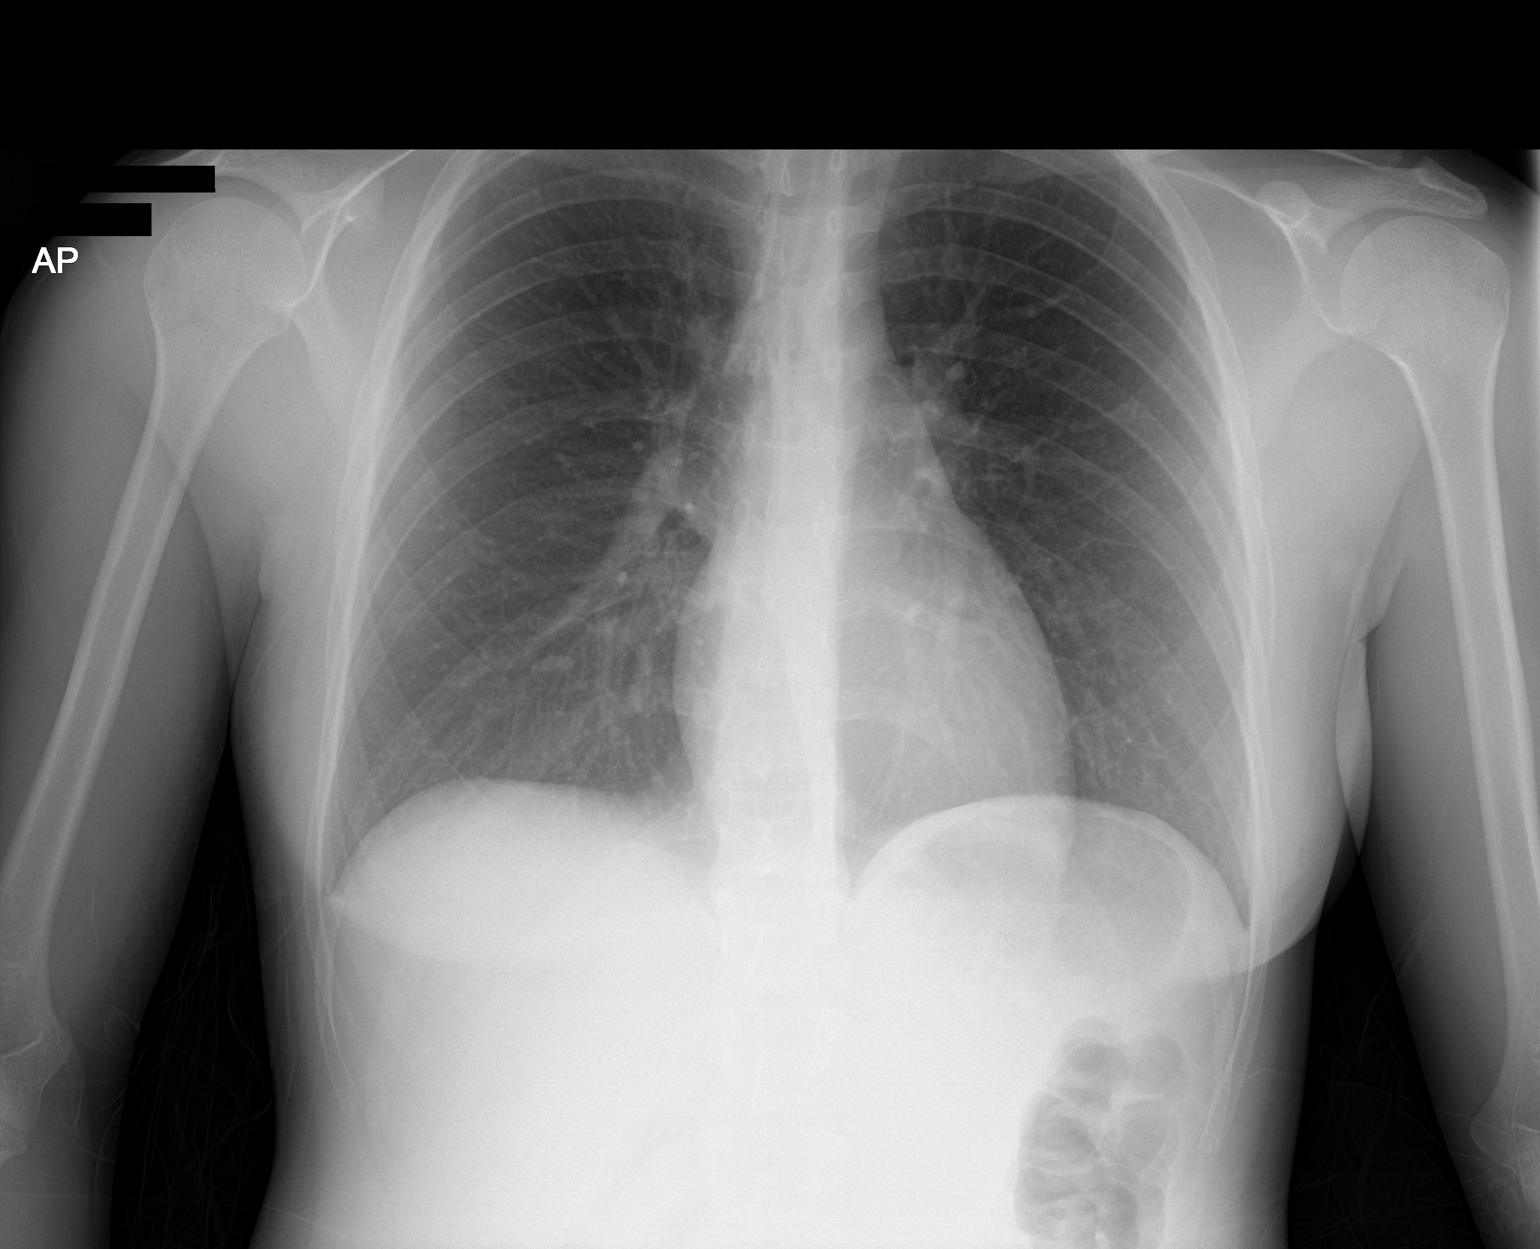

[1 of 1 positions shown; findings below may reference images not displayed]

FINDINGS: The heart size and mediastinal contours are within normal limits.
Both lungs are clear. The visualized skeletal structures are
unremarkable.
IMPRESSION: No active disease.

## 2022-04-12 ENCOUNTER — Encounter: Admit: 2022-04-12 | Payer: PRIVATE HEALTH INSURANCE

## 2022-04-12 DIAGNOSIS — H4710 Unspecified papilledema: Secondary | ICD-10-CM

## 2022-04-12 DIAGNOSIS — R519 Nonintractable headache, unspecified chronicity pattern, unspecified headache type: Secondary | ICD-10-CM

## 2022-04-30 ENCOUNTER — Ambulatory Visit: Admit: 2022-04-30 | Payer: MEDICAID | Attending: Neurology

## 2022-04-30 ENCOUNTER — Ambulatory Visit: Admit: 2022-04-30 | Payer: MEDICAID

## 2022-04-30 ENCOUNTER — Encounter: Admit: 2022-04-30 | Payer: PRIVATE HEALTH INSURANCE | Attending: Neurology

## 2022-04-30 DIAGNOSIS — G43909 Migraine, unspecified, not intractable, without status migrainosus: Secondary | ICD-10-CM

## 2022-04-30 DIAGNOSIS — H5340 Unspecified visual field defects: Secondary | ICD-10-CM

## 2022-04-30 DIAGNOSIS — H509 Unspecified strabismus: Secondary | ICD-10-CM

## 2022-04-30 DIAGNOSIS — H538 Other visual disturbances: Secondary | ICD-10-CM

## 2022-04-30 MED ORDER — TOPIRAMATE 25 MG TABLET
25 mg | ORAL_TABLET | Freq: Every day | ORAL | 12 refills | Status: AC
Start: 2022-04-30 — End: ?

## 2022-04-30 NOTE — Progress Notes
Thank you very much for asking me to see Meredith Medina in neuro-ophthalmology consultation at the [facility]. As you know, she is a 21 y.o. with headaches and abnormal appearance of the optic discs.History of Present Illnessshe had no previous ocular problems. Around September, 2023, she started experiencing new painful headaches with whooshing sounds in her ears. On 04-11-2022 she was seen by Dr. Lona Millard (optometry) for comprehensive eye examination at which time visual acuity was 20/20 in both eyes and the optometrist was concerned about optic nerve head had edema in both eyes. You requested a neuro-ophthalmology consultation. Today, she described no specific vision problems. She described migrainous headaches occurring twice weekly with photophobia phonophobia but no visual aura.ExaminationOn examination today, she was alert, oriented and appropriate. Her height is 5' 1 (1.549 m) and weight is 60.8 kg. Her blood pressure is 124/88 and her pulse is 78.  Corrected distance visual acuity was 20/25 +2 in the right eye and 20/20 in the left eye. Uncorrected near visual acuity was J1+ in the right eye and J1+ in the left eye. Comments: Used ar.. Slit lamp examination performed: YesDirect ocular fundus examination performed: YesIndirect ocular fundus examination performed: YesNotable Examination findingsPupils were symmetric at 6 mm and briskly reactive, without a relative afferent pupillary defect. 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  Macula and retina were normal in both eyes.Testing obtained in officeHumphrey visual fields performed on the 24-2 SITA Standard program had acceptable reliability indices and were full right eye with nonspecific defects in left eye. Optical Coherence Tomography (OCT) of the peripapillary retinal nerve fiber layers showed average thicknesses of 85 microns for the right eye and 99 microns for the left eye. Temporal sector thicknesses were 81 microns for the right eye and 75 microns for the left eye. 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  I reassured her that this is not true papilledema and no additional investigations such as brain imaging or lumbar puncture is warranted.  Headaches sound like migraine. I prescribed topiramate 25 milligrams to be taken once daily.Follow up plan: I arranged to see her again in about 3 months, although I certainly remain available in the interim. ------------------------------------------------------------------------------------------------------------------------------She has no past medical history on file. She has no past surgical history on file. Current medications include No current outpatient medications on file. She has No Known Allergies. She reports that she has never smoked. She has never used smokeless tobacco. Her family history is not on file. Full review of systems (constitutional, eyes, cardiovascular, respiratory, neurological, HENT, skin, musculoskeletal, gastrointestinal, genitourinary, endocrine, psychiatric, heme/lymph and allergy/immunology) was performed and positive for headaches. Otherwise negative.Scribed for Hebert Soho, MD by Quintella Reichert, medical scribe October 31, 2023The documentation recorded by the scribe accurately reflects the services I personally performed and the decisions made by me. I reviewed and confirmed all material entered and/or pre-charted by the scribe. Electronically Signed by Hebert Soho, MD, October 31, 2023TOTAL minutes (New/Consult): On the day of this encounter, a total of 60 minutes was personally spent by me. This does not include and resident/fellow teaching time, or any time spent performing a procedural service but does include time spent doing any combination of the following activities: reviewing previous records, obtaining history from patient and other sources, examining and communicating with patient, documenting the encounter, ordering tests/referrals, coordinating  care, and communicating with other medical professionals.------------------------------------------------------------------------------------------------------------------------------
